# Patient Record
Sex: Female | Born: 1935 | Race: White | Hispanic: No | Marital: Married
Health system: Southern US, Community
[De-identification: ages and names within clinical notes are randomized; demographics above are authoritative.]

---

## 2004-02-07 ENCOUNTER — Ambulatory Visit: Payer: Self-pay | Admitting: Anesthesiology

## 2004-08-03 ENCOUNTER — Emergency Department: Payer: Self-pay | Admitting: Emergency Medicine

## 2004-10-05 ENCOUNTER — Emergency Department: Payer: Self-pay | Admitting: General Practice

## 2005-01-14 ENCOUNTER — Emergency Department: Payer: Self-pay | Admitting: Emergency Medicine

## 2005-01-14 ENCOUNTER — Other Ambulatory Visit: Payer: Self-pay

## 2005-04-21 ENCOUNTER — Emergency Department: Payer: Self-pay | Admitting: Emergency Medicine

## 2005-04-25 ENCOUNTER — Ambulatory Visit: Payer: Self-pay | Admitting: Internal Medicine

## 2005-05-20 ENCOUNTER — Emergency Department: Payer: Self-pay | Admitting: Emergency Medicine

## 2005-06-20 ENCOUNTER — Emergency Department: Payer: Self-pay | Admitting: Emergency Medicine

## 2005-09-21 ENCOUNTER — Emergency Department: Payer: Self-pay | Admitting: Emergency Medicine

## 2006-05-17 ENCOUNTER — Other Ambulatory Visit: Payer: Self-pay

## 2006-05-17 ENCOUNTER — Observation Stay: Payer: Self-pay | Admitting: Internal Medicine

## 2007-09-02 ENCOUNTER — Emergency Department: Payer: Self-pay | Admitting: Emergency Medicine

## 2007-09-02 ENCOUNTER — Other Ambulatory Visit: Payer: Self-pay

## 2007-09-03 IMAGING — CR DG ABDOMEN 1V
1 series · 1 of 1 positions shown · non-contrast
Comparison: none

REASON FOR EXAM: Abdominal pain
COMMENTS:

[view not recorded]
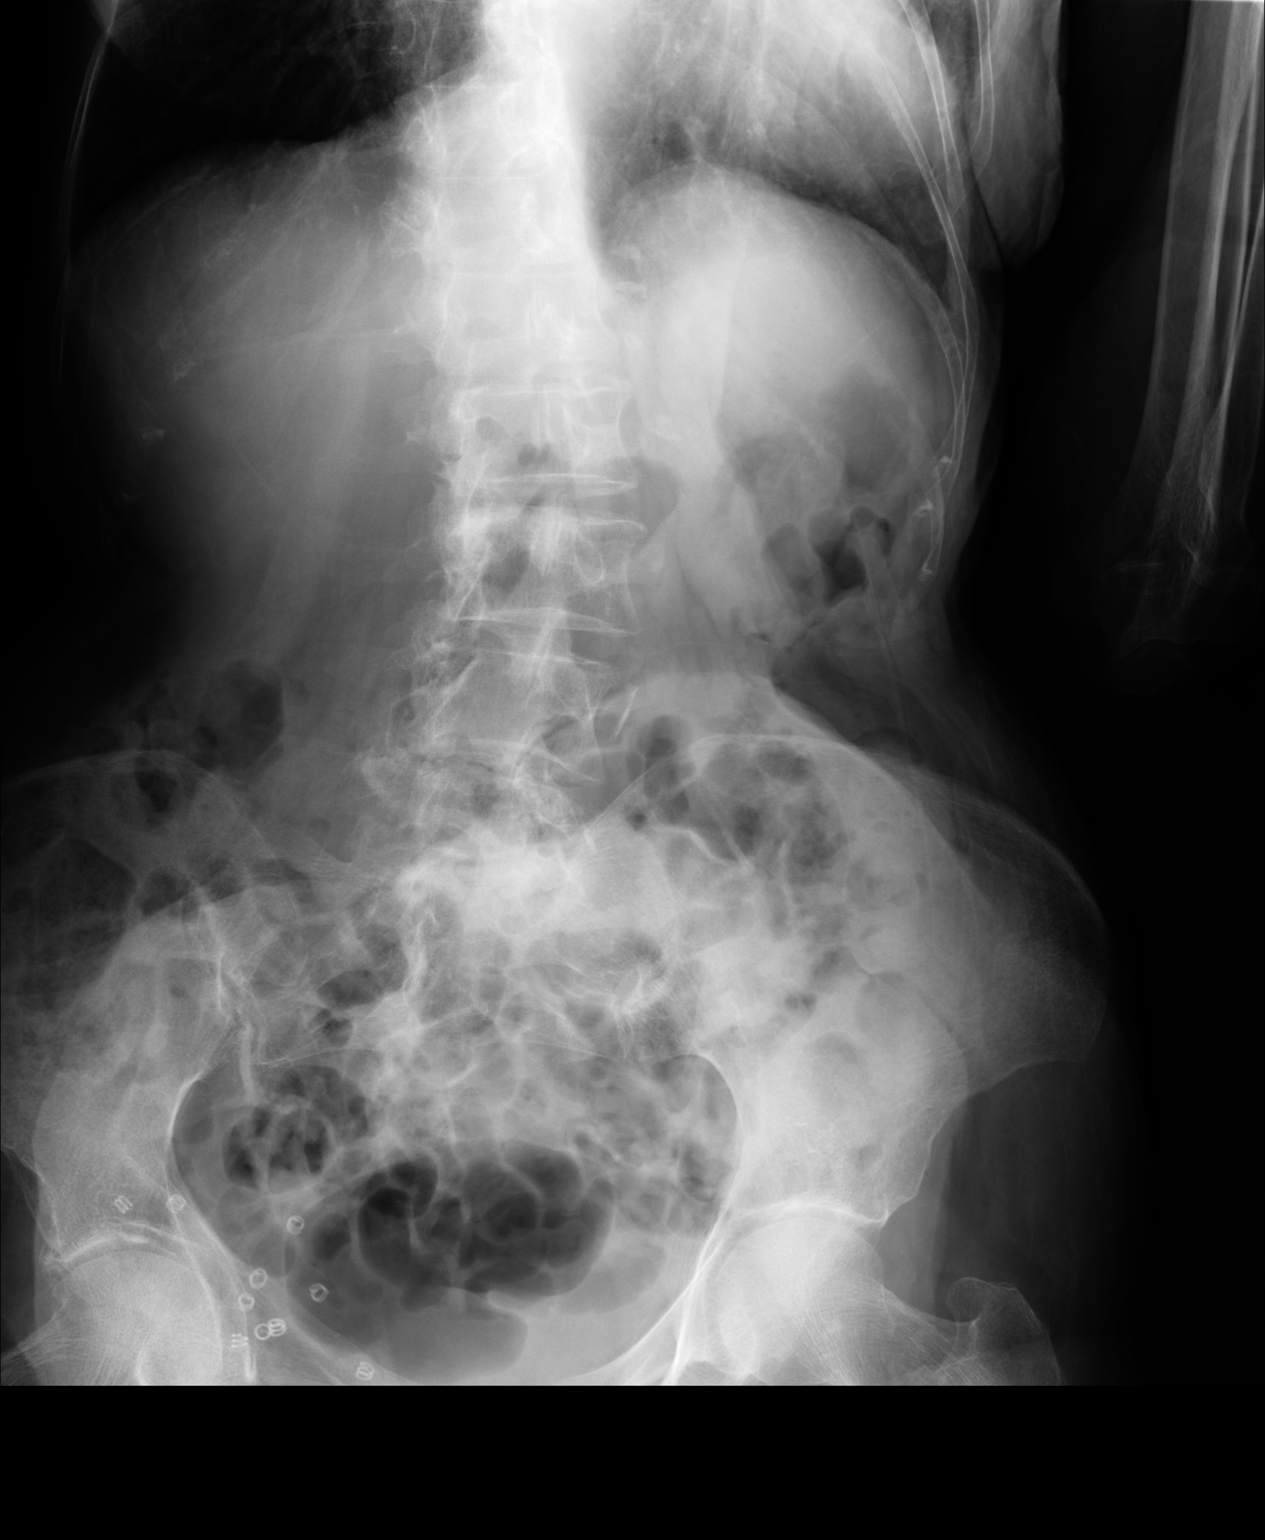

[1 of 1 positions shown; findings below may reference images not displayed]

PROCEDURE:     DXR - DXR KIDNEY URETER BLADDER  - May 20, 2005  [DATE]

RESULT:          Air is seen within nondilated loops of large and small
bowel.  Surgical screws are demonstrated within the RIGHT lower quadrant.
The visualized bony skeleton demonstrates levoscoliosis with a mild rotatory
component.  The remainder of the visualized bony skeleton is unremarkable.
IMPRESSION: Nonobstructive bowel gas pattern as described above.

## 2008-06-28 ENCOUNTER — Ambulatory Visit: Payer: Self-pay | Admitting: Family Medicine

## 2008-12-24 ENCOUNTER — Ambulatory Visit: Payer: Self-pay | Admitting: Geriatric Medicine

## 2009-04-27 ENCOUNTER — Ambulatory Visit: Payer: Self-pay | Admitting: Geriatric Medicine

## 2009-09-15 ENCOUNTER — Observation Stay: Payer: Self-pay | Admitting: Internal Medicine

## 2010-03-22 ENCOUNTER — Emergency Department: Payer: Self-pay | Admitting: Emergency Medicine

## 2010-04-25 ENCOUNTER — Ambulatory Visit: Payer: Self-pay | Admitting: Geriatric Medicine

## 2011-03-20 ENCOUNTER — Other Ambulatory Visit: Payer: Self-pay | Admitting: Geriatric Medicine

## 2011-09-20 ENCOUNTER — Ambulatory Visit: Payer: Self-pay | Admitting: Internal Medicine

## 2011-09-24 ENCOUNTER — Inpatient Hospital Stay: Payer: Self-pay | Admitting: Internal Medicine

## 2011-09-24 LAB — COMPREHENSIVE METABOLIC PANEL
Albumin: 3.1 g/dL — ABNORMAL LOW (ref 3.4–5.0)
Calcium, Total: 9.4 mg/dL (ref 8.5–10.1)
Co2: 36 mmol/L — ABNORMAL HIGH (ref 21–32)
Creatinine: 1.04 mg/dL (ref 0.60–1.30)
EGFR (Non-African Amer.): 45 — ABNORMAL LOW
Glucose: 97 mg/dL (ref 65–99)
Potassium: 2.6 mmol/L — ABNORMAL LOW (ref 3.5–5.1)
SGOT(AST): 38 U/L — ABNORMAL HIGH (ref 15–37)
SGPT (ALT): 25 U/L
Total Protein: 7.9 g/dL (ref 6.4–8.2)

## 2011-09-24 LAB — CBC
HGB: 14.4 g/dL (ref 12.0–16.0)
MCH: 32.9 pg (ref 26.0–34.0)
Platelet: 209 10*3/uL (ref 150–440)
RBC: 4.39 10*6/uL (ref 3.80–5.20)
RDW: 14 % (ref 11.5–14.5)

## 2011-09-24 LAB — LIPASE, BLOOD: Lipase: 128 U/L (ref 73–393)

## 2011-09-24 LAB — MAGNESIUM: Magnesium: 2.7 mg/dL — ABNORMAL HIGH

## 2011-09-25 LAB — COMPREHENSIVE METABOLIC PANEL
Anion Gap: 9 (ref 7–16)
BUN: 22 mg/dL — ABNORMAL HIGH (ref 7–18)
Bilirubin,Total: 0.7 mg/dL (ref 0.2–1.0)
Calcium, Total: 8.3 mg/dL — ABNORMAL LOW (ref 8.5–10.1)
Creatinine: 0.74 mg/dL (ref 0.60–1.30)
EGFR (Non-African Amer.): >60
Glucose: 106 mg/dL — ABNORMAL HIGH (ref 65–99)
Osmolality: 311 (ref 275–301)
Potassium: 3.5 mmol/L (ref 3.5–5.1)
SGOT(AST): 28 U/L (ref 15–37)
Total Protein: 7.2 g/dL (ref 6.4–8.2)

## 2011-09-25 LAB — SODIUM: Sodium: 155 mmol/L — ABNORMAL HIGH (ref 136–145)

## 2011-09-26 LAB — MAGNESIUM: Magnesium: 2.3 mg/dL

## 2011-09-26 LAB — BASIC METABOLIC PANEL
Anion Gap: 8 (ref 7–16)
Calcium, Total: 8.1 mg/dL — ABNORMAL LOW (ref 8.5–10.1)
Creatinine: 0.89 mg/dL (ref 0.60–1.30)
EGFR (Non-African Amer.): 54 — ABNORMAL LOW
Glucose: 89 mg/dL (ref 65–99)
Osmolality: 299 (ref 275–301)
Potassium: 3.1 mmol/L — ABNORMAL LOW (ref 3.5–5.1)

## 2011-09-27 LAB — BASIC METABOLIC PANEL
Anion Gap: 4 — ABNORMAL LOW (ref 7–16)
BUN: 20 mg/dL — ABNORMAL HIGH (ref 7–18)
Calcium, Total: 8.2 mg/dL — ABNORMAL LOW (ref 8.5–10.1)
Co2: 30 mmol/L (ref 21–32)
Creatinine: 0.91 mg/dL (ref 0.60–1.30)
EGFR (Non-African Amer.): 53 — ABNORMAL LOW
Sodium: 147 mmol/L — ABNORMAL HIGH (ref 136–145)

## 2011-09-27 LAB — MAGNESIUM: Magnesium: 2.3 mg/dL

## 2011-10-20 ENCOUNTER — Ambulatory Visit: Payer: Self-pay | Admitting: Internal Medicine

## 2011-11-20 DEATH — deceased

## 2011-12-30 IMAGING — CT CT HEAD WITHOUT CONTRAST
2 series · 16 of 30 positions shown, 20 images · non-contrast
Comparison: none

REASON FOR EXAM: AMS
COMMENTS:

[Series 2: without · axial · non-contrast · 0.44mm/px · z∈[+878,+998]mm · 13 of 30 slices shown, 17 images]
[im 3/30  brain]
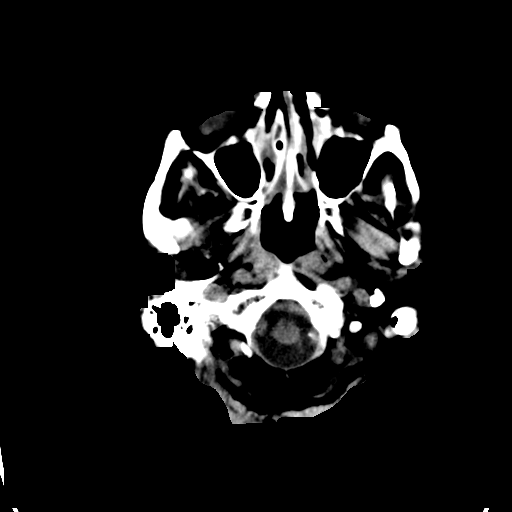
[im 3/30  bone]
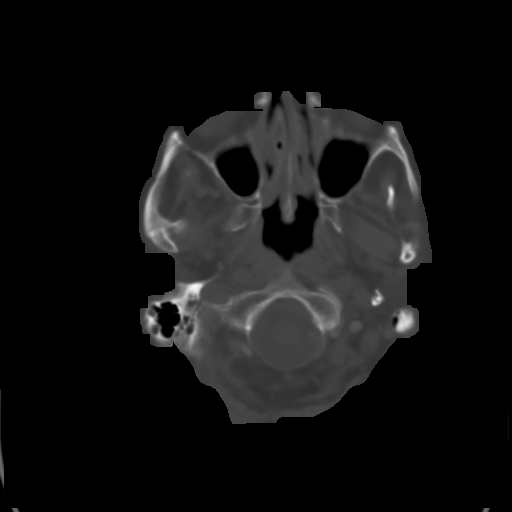
[im 5/30  brain]
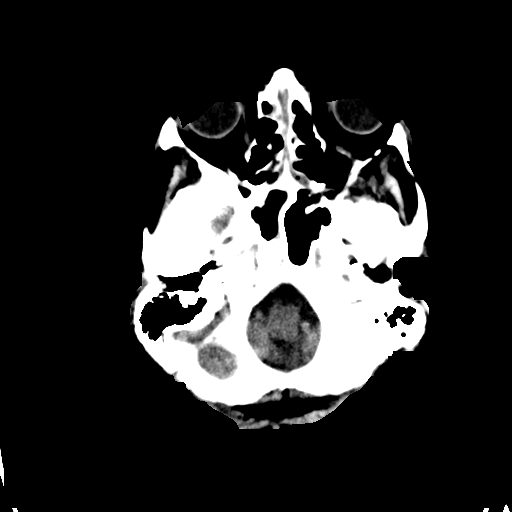
[im 7/30  brain]
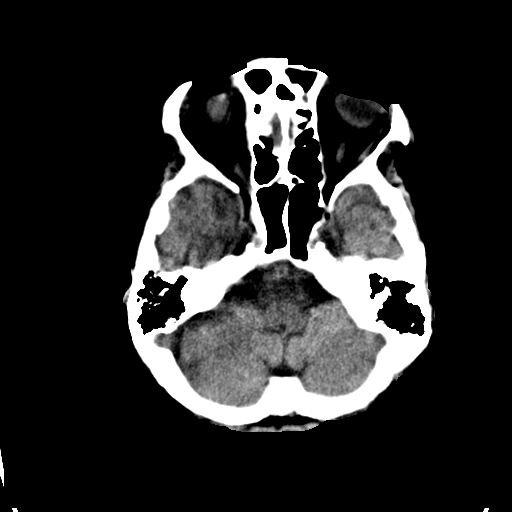
[im 9/30  brain]
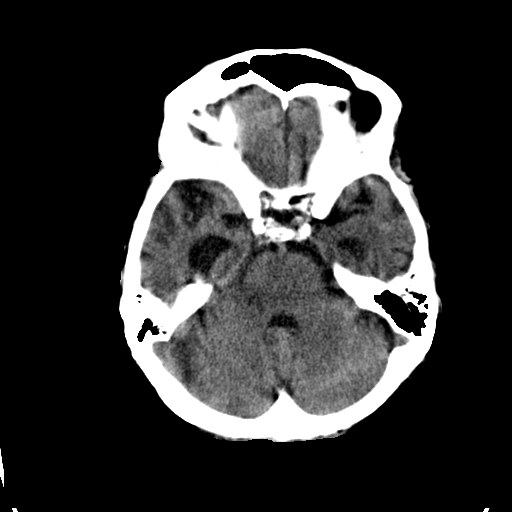
[im 11/30  brain]
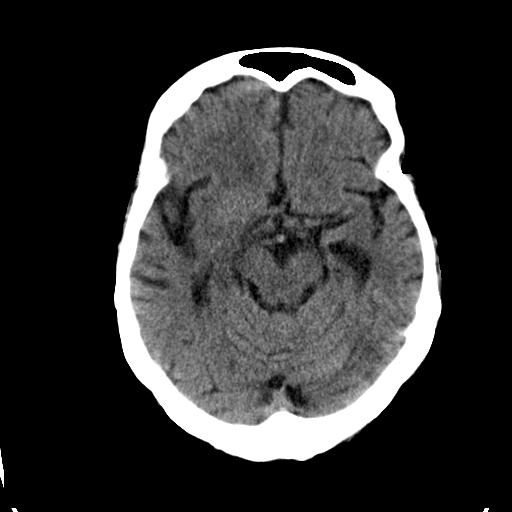
[im 11/30  bone]
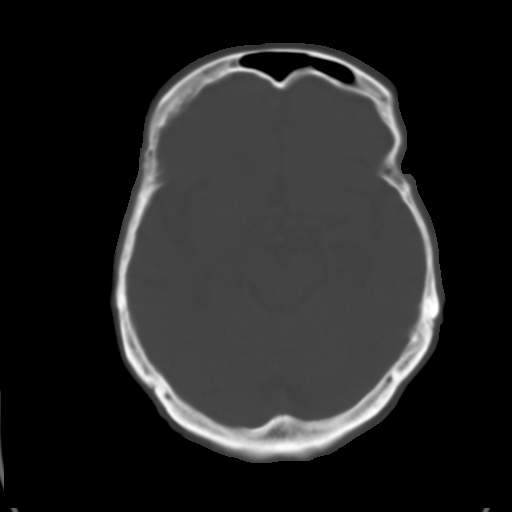
[im 13/30  brain]
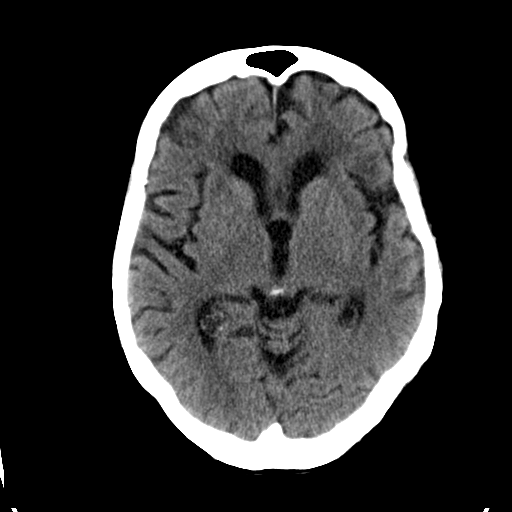
[im 15/30  brain]
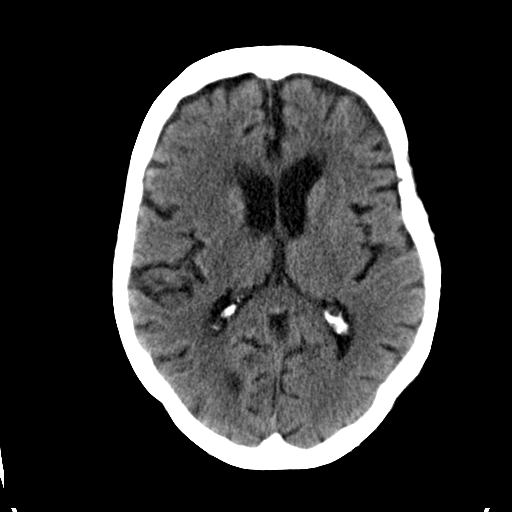
[im 17/30  brain]
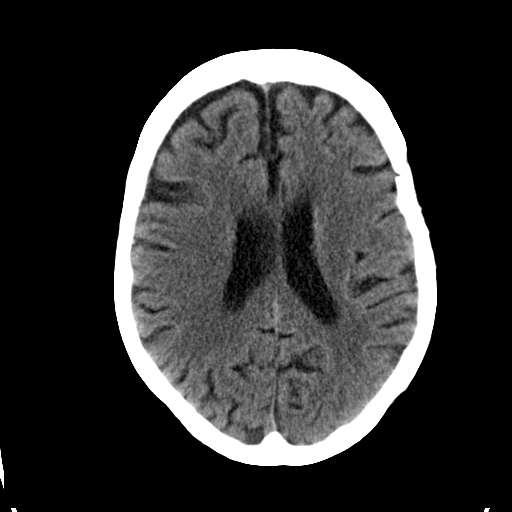
[im 19/30  brain]
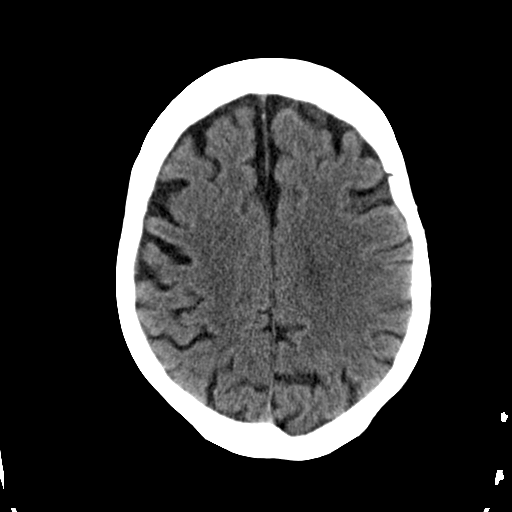
[im 19/30  bone]
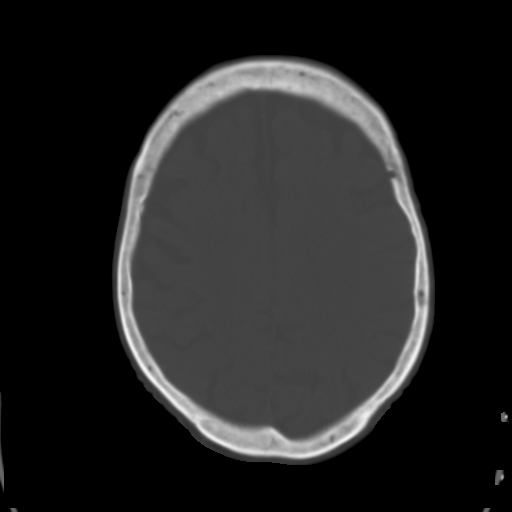
[im 21/30  brain]
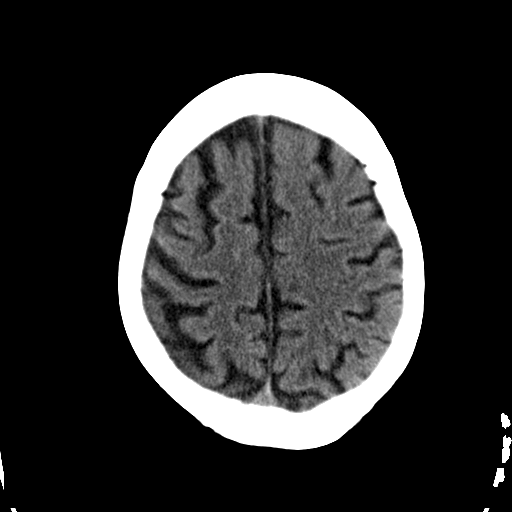
[im 23/30  brain]
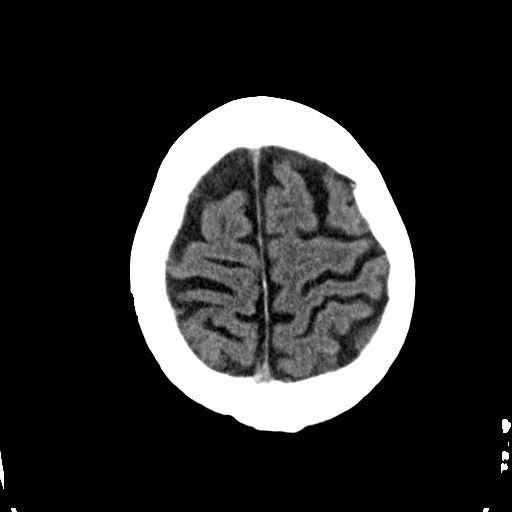
[im 25/30  brain]
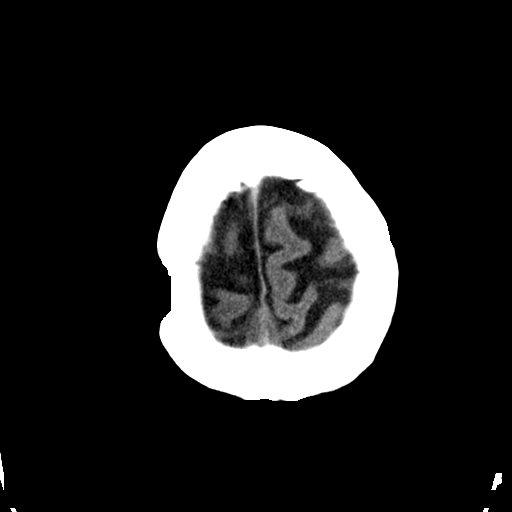
[im 27/30  brain]
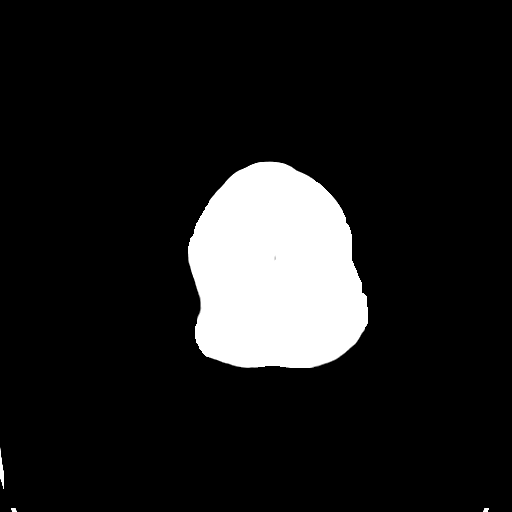
[im 27/30  bone]
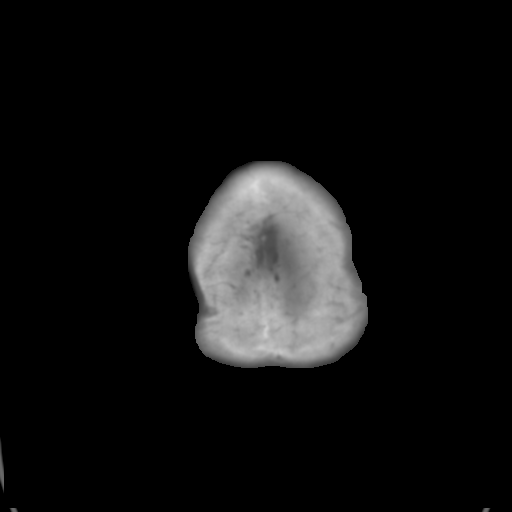

[Series 3: bone · axial · 0.44mm/px · z∈[+878,+918]mm · 3 of 30 slices shown]
[im 3/30  bone]
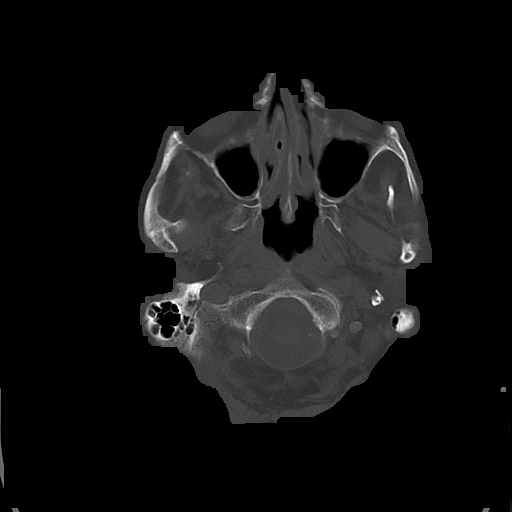
[im 7/30  bone]
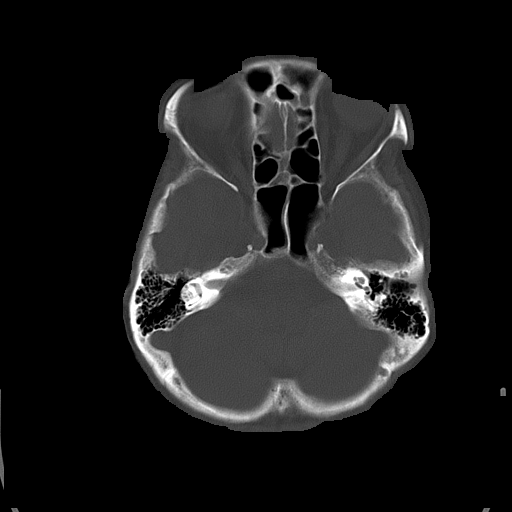
[im 11/30  bone]
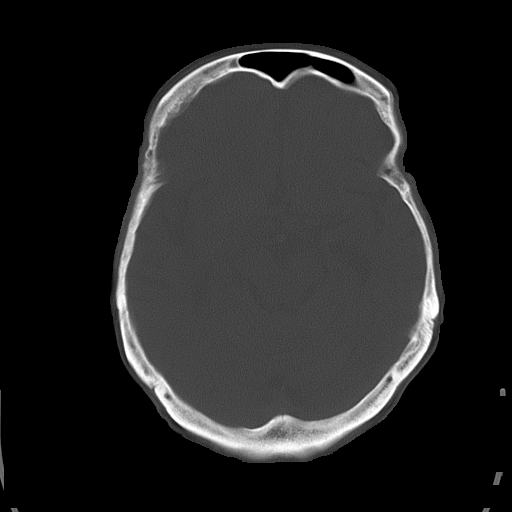

[16 of 30 positions shown; findings below may reference images not displayed]

PROCEDURE:     CT  - CT HEAD WITHOUT CONTRAST  - September 15, 2009  [DATE]

RESULT:     Noncontrast emergent CT of the brain is compared to previous
examination dated 06/28/2008 and to a study of 05/17/2006. The ventricles and
sulci show prominence consistent with atrophy. There is no intracranial
hemorrhage, mass effect or midline shift. There is no territorial infarct.
Mucosal thickening is seen in the right sphenoid, bilateral ethmoid and
right maxillary sinuses. The other sinuses and mastoid show normal aeration.
The calvarium is intact but there is deformity in the high right parietal
region which may reflect previous surgery or trauma. This is unchanged..
IMPRESSION: 1. No acute intracranial abnormality.

## 2014-08-13 NOTE — H&P (Signed)
PATIENT NAME:  Sandra Fields, Sandra Fields MR#:  [id number] DATE OF BIRTH:  01/22/1911  DATE OF ADMISSION:  09/24/2011  REFERRING PHYSICIAN: Dr. Clemens Catholic. PRIMARY CARE PHYSICIAN:  Dr. Larena Sox at [facility].   PRESENTING COMPLAINT: Decreased p.o. intake for the past three weeks and weakness.   HISTORY OF PRESENT ILLNESS: Sandra Fields is a 81 year old woman with history of Alzheimer's dementia, spinal stenosis and chronic pain, diverticulosis, irritable bowel syndrome, hyperlipidemia, anxiety, depression, hypothyroidism, who presents with her son present in the room who is power of attorney. Apparently 3 to 4 weeks ago was treated for pneumonia. She has completed her antibiotic treatment but she has continued decline. She has not regained her desire to eat. She has at least continued to drink. She declines p.o. when offered. She has been complaining of back and side pain. The patient is unable to provide history. All information is given by her son. No other complains as far as he knows.   PAST MEDICAL HISTORY:  1. Alzheimer's dementia.  2. Spinal stenosis and chronic back pain.  3. Diverticulosis.  4. Irritable bowel syndrome.  5. Insomnia.  6. Hyperlipidemia.  7. Scoliosis.  8. Dysphagia.  9. Esophagitis.  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  11. Anxiety and depression.  12. History of H. pylori positive.  13. Chronic anemia.  14. Hypothyroidism.   PAST SURGICAL HISTORY: None.   SOCIAL HISTORY: No tobacco, alcohol or drug use. She currently resides at [facility]. Bobetta Korf, her son's number is [phone].   FAMILY HISTORY: Sister and brother both have Alzheimer's. No family history of heart disease, diabetes or stroke.   ALLERGIES: Penicillin, Elavil, codeine and Biaxin.   MEDICATIONS:  1. Med Pass 240 mL daily.  2. Norco 5/325, one tablet every four hours as needed.  3. Valium 2.5 mg every eight hours as needed.  4. DuoNebs 3 times daily as needed.  5. Citracal plus D 2 tablets b.i.d.   6. Namenda 5 mg b.i.d.  7. Synthroid 25 mcg daily.  8. Lexapro 10 mg daily.  9. MiraLAX 17 grams daily.  10. Multivitamin daily.  11. Valium 2.5 mg daily.  12. Aricept 10 mg daily.  13. Lidocaine 5% ointment to lower back twice daily as needed.   REVIEW OF SYSTEMS: As per history of present illness. The patient is unable to provide any review of systems due to her advanced Alzheimer's.   PHYSICAL EXAMINATION:  VITAL SIGNS: Temperature 99.3, pulse 83, respiratory rate 24, blood pressure 168/91, sating at 100% on room air.   GENERAL: Lying in bed in no apparent distress.   HEENT: Normocephalic, atraumatic. Pupils are equal, symmetric. Nares without discharge. She has slightly dry mucous membrane.   NECK: Soft and supple. No adenopathy.   CARDIOVASCULAR: Non-tachy. No murmurs, rubs, or gallops.   LUNGS: Clear to auscultation bilaterally. No use of accessory muscles or increased respiratory effort.   ABDOMEN: Soft. Positive bowel sounds. No mass appreciated.   EXTREMITIES: Trace edema. Dorsal pedis pulses intact.   SKIN: Thin with multiple ecchymosis.   MUSCULOSKELETAL: No joint effusion.   NEUROLOGIC: The patient is not cooperating with neurological exam.   PSYCH: She is alert but not oriented.   PERTINENT LABS AND STUDIES: WBC 9.3, hemoglobin 14.4, hematocrit 42.9, platelet 209, MCV 98, glucose 97, BUN 30, creatinine 1.04, sodium 158, potassium 2.6, chloride 116, carbon dioxide 36, calcium 9.4, total bilirubin 0.5, alkaline phosphatase 84, ALT 25, AST 38, total protein 7.9, albumin 3.1, lipase 128, magnesium is 2.7, TSH  is 3.98. EKG with atrial fibrillation, rate of 94. No ST changes.   ASSESSMENT AND PLAN: Ms. Sandra Fields is a 81 year old woman with history of Alzheimer's dementia, spinal stenosis and chronic pain, irritable bowel syndrome, hyperlipidemia, depression, anxiety, and hypothyroidism, anemia presenting with poor p.o. intake, and weakness. 1. Dehydration, adult failure  to thrive, as seen by elevated creatinine from baseline with elevated BUN, hypernatremia and hyperkalemia. This is likely a progression of her Alzheimer's. She recently completed pneumonia treatment greater than three weeks ago. No evidence of untreated infection currently. Continue IV fluids. Discussed with the son at length. Will have palliative consultation involved. She may benefit from transfer to hospice facility [facility] especially if no response to IV fluids.  2. Atrial fibrillation, likely in the setting of dehydration and electrolyte abnormalities. She is now in normal sinus. No documentation in the past of atrial fibrillation. Start her on aspirin for now and EKG in the morning.  3. Spinal stenosis and chronic pain. Resume her lidocaine, Norco and Valium as needed. Also, resume her standing order of Valium.  4. Alzheimer's dementia. Resume Namenda and Aricept. 5. History of esophagitis and positive H pylori. Will start Protonix IV.  6. Prophylaxis with Lovenox, aspirin and Protonix.   TIME SPENT: Approximately 50 minutes were spent on patient care.   ____________________________ Reuel DerbyAlounthith Regnia Mathwig, MD ap:ap D: 09/24/2011 23:25:15 ET T: 09/25/2011 08:01:48 ET JOB#: [id number]  cc: Pearlean BrownieAlounthith Maxx Calaway, MD, <Dictator> Glenetta BorgMaria-Dorina Sevilla, MD  Reuel DerbyALOUNTHITH Oree Mirelez MD ELECTRONICALLY SIGNED 10/10/2011 0:26

## 2014-08-13 NOTE — Discharge Summary (Signed)
PATIENT NAME:  Sandra Fields, Sandra Fields MR#:  [id number] DATE OF BIRTH:  Nov 24, 1910  DATE OF ADMISSION:  09/24/2011 DATE OF DISCHARGE:  09/27/2011  ADMITTING DIAGNOSIS: Decrease in p.o. intake, weakness.   DISCHARGE DIAGNOSES:  1. Severe hyponatremia due to dehydration and adult failure to five. Due to patient's advanced age and lack of improvement has been arranged for hospice home.  2. Likely progression of Alzheimer's dementia.  3. Electrolyte imbalances, improved with hydration.  4. Atrial fibrillation in the setting of dehydration and electrolyte abnormalities.  5. Spinal stenosis and chronic back pain as well as chronic thoracic fractures due to osteoporosis.  6. Progressive Alzheimer's dementia.  7. History of esophagitis in the past.  8. History of diverticulosis.  9. History of irritable bowel syndrome.  10. History insomnia.  11. Hyperlipidemia.  12. Scoliosis.  13. Dysphagia.  14. History of hemosiderosis.  15. Anxiety and depression.  16. Chronic anemia.   CONSULTANT: Palliative care team.   LABORATORY, DIAGNOSTIC AND RADIOLOGICAL DATA: Admitting glucose 97, BUN 30, creatinine 1.04, sodium 158, potassium 2.6, chloride 116, CO2 36, lipase 124. LFTs: Total protein 7.9, albumin 3.1, bilirubin total 0.5, alkaline phosphatase 84, AST 38, TSH 3.98, WBC 9.3, hemoglobin 14.4, platelet count 209. EKG showed atrial fibrillation. Chest  showed borderline cardiomegaly, mild interstitial edema. Thoracic spine compression fracture.   HOSPITAL COURSE: Please refer to history and physical done by the admitting physician. Patient is a 81 year old white female with history of Alzheimer's dementia, spinal stenosis, chronic pain, diverticulosis, irritable bowel syndrome, hyperlipidemia, anxiety, depression, hypothyroidism who was diagnosed with pneumonia and was treated in the nursing facility. Patient continued to get weak, was not eating, drinking much and she continued to decline. Therefore was  brought to the ED. In the ER was noted to have severe hyponatremia. Patient was admitted, given IV fluids. Due to her advanced age and lack of improvement palliative care consultation was obtained. Patient is DO NOT RESUSCITATE. Further discussions were held with the family. They wished her to be monitored in the hospital. She was kept in the hospital. Discussed with the family regarding her poor prognosis with her advanced age. They still wanted to try some appetite stimulant to see if that would help. They have agreed for her to go to hospice home. They understand that she will not be getting any fluids or blood work checked there. At this time she is stable enough for hospice home.   DISCHARGE DIET: As tolerated.   ACTIVITY: As tolerated.   CODE STATUS: DO NOT RESUSCITATE.   OXYGEN: Oxygen p.r.n.   MEDICATIONS:  1. Synthroid 25 mcg daily.  2. Lexapro 10 daily.  3. MiraLax 17 grams in 8 ounces of water daily.  4. Multivitamin daily.  5. Valium 2.5 mg daily in the morning. 6. Aricept 10 daily.  7. Namenda 5 mg 1 tab p.o. b.i.d.  8. Norco 5/325 q.4 p.r.n.  9. Valium 2.5 mg q.8 p.r.n. anxiety.  10. Lidocaine 5% topically apply to lower back 2 times a day as needed for back pain. 11. Megace 400 mg/10 mL, it is a liquid solution so if she should get equal 400 mg p.o. daily.   DISCHARGE FOLLOW UP: None. Patient is going to the hospice home center.       TIME SPENT: 35 minutes spent in coordinating care.  ____________________________ Lacie ScottsShreyang H. Allena KatzPatel, MD shp:cms D: 09/27/2011 13:37:41 ET T: 09/29/2011 11:39:35 ET JOB#: [id number]  cc: Kristine Tiley H. Allena KatzPatel, MD, <Dictator> Charise CarwinSHREYANG H Emerly Prak MD ELECTRONICALLY  SIGNED 10/08/2011 11:38
